# Patient Record
Sex: Male | Born: 1995 | Race: Black or African American | Hispanic: No | Marital: Single | Smoking: Heavy tobacco smoker
Health system: Southern US, Community
[De-identification: ages and names within clinical notes are randomized; demographics above are authoritative.]

---

## 2014-10-27 ENCOUNTER — Emergency Department
Admission: EM | Admit: 2014-10-27 | Discharge: 2014-10-27 | Discharge status: Against Medical Advice | Payer: 59 | Attending: Emergency Medicine | Admitting: Emergency Medicine

## 2014-10-27 DIAGNOSIS — M79673 Pain in unspecified foot: Secondary | ICD-10-CM | POA: Insufficient documentation

## 2014-10-27 DIAGNOSIS — Z720 Tobacco use: Secondary | ICD-10-CM | POA: Insufficient documentation

## 2014-10-27 NOTE — ED Notes (Signed)
Due to pt being registered pt could not be dismissed out of the computer. Pt was not triaged and brought straight back to the room.  Pt left with a few min's of getting into the room and this writer was told by registration that the pt had left. Will have to d/c pt to get him off the board, but he should be a dismiss.

## 2014-10-27 NOTE — ED Notes (Signed)
Registration out to the nurses station stating that the pt had to leave and that he would be back in an hr. Pt was informed by registration that he needed to stay and that he would be charged for another ER visit. Pt stated understanding to registration and left. Pt was not seen or assessed by this Clinical research associate.

## 2014-10-31 ENCOUNTER — Encounter: Payer: Self-pay | Admitting: Emergency Medicine

## 2014-10-31 ENCOUNTER — Emergency Department
Admission: EM | Admit: 2014-10-31 | Discharge: 2014-10-31 | Disposition: A | Discharge status: Home / Self Care | Payer: 59 | Attending: Emergency Medicine | Admitting: Emergency Medicine

## 2014-10-31 ENCOUNTER — Emergency Department: Payer: 59

## 2014-10-31 DIAGNOSIS — Z720 Tobacco use: Secondary | ICD-10-CM | POA: Insufficient documentation

## 2014-10-31 DIAGNOSIS — R110 Nausea: Secondary | ICD-10-CM | POA: Diagnosis not present

## 2014-10-31 DIAGNOSIS — R079 Chest pain, unspecified: Secondary | ICD-10-CM | POA: Insufficient documentation

## 2014-10-31 LAB — CBC
HCT: 44.7 % (ref 40.0–52.0)
Hemoglobin: 14.8 g/dL (ref 13.0–18.0)
MCH: 29 pg (ref 26.0–34.0)
MCHC: 33.1 g/dL (ref 32.0–36.0)
MCV: 87.8 fL (ref 80.0–100.0)
PLATELETS: 296 10*3/uL (ref 150–440)
RBC: 5.09 MIL/uL (ref 4.40–5.90)
RDW: 13.6 % (ref 11.5–14.5)
WBC: 4.7 10*3/uL (ref 3.8–10.6)

## 2014-10-31 LAB — COMPREHENSIVE METABOLIC PANEL
ALBUMIN: 4.6 g/dL (ref 3.5–5.0)
ALK PHOS: 78 U/L (ref 38–126)
ALT: 14 U/L — ABNORMAL LOW (ref 17–63)
AST: 18 U/L (ref 15–41)
Anion gap: 6 (ref 5–15)
BILIRUBIN TOTAL: 0.3 mg/dL (ref 0.3–1.2)
BUN: 11 mg/dL (ref 6–20)
CALCIUM: 9.4 mg/dL (ref 8.9–10.3)
CO2: 29 mmol/L (ref 22–32)
CREATININE: 0.8 mg/dL (ref 0.61–1.24)
Chloride: 107 mmol/L (ref 101–111)
GFR calc Af Amer: >60 mL/min (ref >60)
GLUCOSE: 91 mg/dL (ref 65–99)
Potassium: 4 mmol/L (ref 3.5–5.1)
Sodium: 142 mmol/L (ref 135–145)
TOTAL PROTEIN: 7.2 g/dL (ref 6.5–8.1)

## 2014-10-31 LAB — TROPONIN I

## 2014-10-31 MED ORDER — GI COCKTAIL ~~LOC~~: 30.0000 mL | Freq: Once | ORAL | Status: DC

## 2014-10-31 NOTE — ED Notes (Signed)
Nurse to room to administer medication. Pt gown on bed. Pt not in room. Pt appears to have left.

## 2014-10-31 NOTE — ED Notes (Signed)
Pt left without undergoing the nursing  discharge process receiving discharge paperwork. Pt contacted by this nurse and informed him that his discharge instructions will be available for him to pick up at the discharge desk.

## 2014-10-31 NOTE — Discharge Instructions (Signed)
Chest Pain (Nonspecific) It is often hard to give a diagnosis for the cause of chest pain. There is always a chance that your pain could be related to something serious, such as a heart attack or a blood clot in the lungs. You need to follow up with your doctor. HOME CARE  If antibiotic medicine was given, take it as directed by your doctor. Finish the medicine even if you start to feel better.  For the next few days, avoid activities that bring on chest pain. Continue physical activities as told by your doctor.  Do not use any tobacco products. This includes cigarettes, chewing tobacco, and e-cigarettes.  Avoid drinking alcohol.  Only take medicine as told by your doctor.  Follow your doctor's suggestions for more testing if your chest pain does not go away.  Keep all doctor visits you made. GET HELP IF:  Your chest pain does not go away, even after treatment.  You have a rash with blisters on your chest.  You have a fever. GET HELP RIGHT AWAY IF:   You have more pain or pain that spreads to your arm, neck, jaw, back, or belly (abdomen).  You have shortness of breath.  You cough more than usual or cough up blood.  You have very bad back or belly pain.  You feel sick to your stomach (nauseous) or throw up (vomit).  You have very bad weakness.  You pass out (faint).  You have chills. This is an emergency. Do not wait to see if the problems will go away. Call your local emergency services (911 in [place] Do not drive yourself to the hospital. MAKE SURE YOU:   Understand these instructions.  Will watch your condition.  Will get help right away if you are not doing well or get worse. Document Released: 07/12/2007 Document Revised: 01/28/2013 Document Reviewed: 07/12/2007 [facility] Patient Information 2015 [place], [place]. This information is not intended to replace advice given to you by your health care provider. Make sure you discuss any questions you have with your  health care provider.   Please drink alcohol in moderation. Eat small meals throughout the day rather than large meals in one sitting.  Please return to the emergency department if he develops chest pain, fainting, palpitations, shortness of breath, or any other symptoms concerning to you.

## 2014-10-31 NOTE — ED Notes (Signed)
Pt arrived to the ED for complaints of chest pain x2 hr that woke him up from her sleep. Pt reports that he does not have any kind of cardiac history. Pt is AOx4 in no apparent distress.

## 2014-10-31 NOTE — ED Provider Notes (Signed)
[facility] Emergency Department [place] Note  ____________________________________________  Time seen: Approximately 8:28 AM  I have reviewed the triage vital signs and the nursing notes.   HISTORY  Chief Complaint Chest Pain    HPI Jonathan Hubbard is a 19 y.o. male , smoker, presenting with chest pain. Patient reports that he awoke 30 minutes prior to arrival with a midline substernal "ache" in his chest that was nonradiating. No associated diaphoresis, vomiting, palpitations, lightheadedness or syncope. He does report some mild nausea but that has resolved. He reports that last night he ate pizza and drank multiple alcoholic beverages. At this time, the patient continues to have a dull ache but symptoms are improved.    History reviewed. No pertinent past medical history. Denies cardiac history.   There are no active problems to display for this patient.  none  History reviewed. No pertinent past surgical history.  No current outpatient prescriptions on file.  Allergies Review of patient's allergies indicates no known allergies.  History reviewed. No pertinent family history.  Denies history of sudden cardiac death or early, unexplained deaths in his family.  Social History Social History  Substance Use Topics  . Smoking status: Heavy Tobacco Smoker  . Smokeless tobacco: None  . Alcohol Use: Yes   denies cocaine; occasional marijuana user  Review of Systems Constitutional: No fever/chills Eyes: No visual changes. ENT: No sore throat. Cardiovascular: Denies chest pain, palpitations. Respiratory: Denies shortness of breath.  No cough. Gastrointestinal: No abdominal pain.  No nausea, no vomiting.  No diarrhea.  No constipation. Genitourinary: Negative for dysuria. Musculoskeletal: Negative for back pain. Skin: Negative for rash. Neurological: Negative for headaches, focal weakness or numbness.  10-point ROS otherwise  negative.  ____________________________________________   PHYSICAL EXAM:  VITAL SIGNS: ED Triage Vitals  Enc Vitals Group     BP 10/31/14 0616 118/69 mmHg     Pulse Rate 10/31/14 0616 68     Resp 10/31/14 0616 18     Temp 10/31/14 0616 98 F (36.7 C)     Temp Source 10/31/14 0616 Oral     SpO2 10/31/14 0616 99 %     Weight 10/31/14 0616 145 lb (65.772 kg)     Height 10/31/14 0616  (1.753 m)     Head Cir --      Peak Flow --      Pain Score 10/31/14 0617 8     Pain Loc --      Pain Edu? --      Excl. in GC? --     Constitutional: Alert and oriented. Well appearing and in no acute distress. Answer question appropriately. Eyes: Conjunctivae are normal.  EOMI. Head: Atraumatic. Nose: No congestion/rhinnorhea. Mouth/Throat: Mucous membranes are moist.  Neck: No stridor.  Supple.  No JVD Cardiovascular: Normal rate, regular rhythm. No murmurs, rubs or gallops.  Respiratory: Normal respiratory effort.  No retractions. Lungs CTAB.  No wheezes, rales or ronchi. Gastrointestinal: Soft and nontender. No distention. No peritoneal signs. Musculoskeletal: No LE edema. No palpable cords or Homans sign. Neurologic:  Normal speech and language. No gross focal neurologic deficits are appreciated.  Skin:  Skin is warm, dry and intact. No rash noted. Psychiatric: Mood and affect are normal. Speech and behavior are normal.  Normal judgement.  ____________________________________________   LABS (all labs ordered are listed, but only abnormal results are displayed)  Labs Reviewed  COMPREHENSIVE METABOLIC PANEL - Abnormal; Notable for the following:    ALT 14 (*)  All other components within normal limits  CBC  TROPONIN I   ____________________________________________  EKG  ED ECG REPORT I, Rockne Menghini, the attending physician, personally viewed and interpreted this ECG.   Date: 10/31/2014  EKG Time: 6:15  Rate: 61  Rhythm: normal sinus rhythm  Axis: normal   Intervals:none  ST&T Change: Nonspecific T-wave inversion in V1. 1 mm ST elevation in V2 and V3. The findings on this EKG are most consistent with early re-polarization rather than ischemic changes.  There are no previous EKGs on file for comparison.  ____________________________________________  RADIOLOGY  Dg Chest 2 View  10/31/2014   CLINICAL DATA:  Chest pain this morning.  Nonsmoker.  EXAM: CHEST  2 VIEW  COMPARISON:  None.  FINDINGS: Hyperinflation. The heart size and mediastinal contours are within normal limits. Both lungs are clear. The visualized skeletal structures are unremarkable.  IMPRESSION: No active cardiopulmonary disease.   Electronically Signed   By: Burman Nieves M.D.   On: 10/31/2014 06:42    ____________________________________________   PROCEDURES  Procedure(s) performed: None  Critical Care performed: No ____________________________________________   INITIAL IMPRESSION / ASSESSMENT AND PLAN / ED COURSE  Pertinent labs & imaging results that were available during my care of the patient were reviewed by me and considered in my medical decision making (see chart for details).  19 y.o. male without significant cardiac risk factors and his personal or family history presenting with chest pain. The patient's EKG does not show any ischemic changes, his labs are reassuring and his chest x-ray does not show any acute cardiopulmonary disease. It is possible that he has reflux after an evening of pizza and drinking. He does not have an arrhythmia and acute MI is unlikely especially because he denies cocaine. Dissection is also unlikely, patient has no clinical symptoms that would be consistent with PE. There are no symptoms for an infectious process causing this pain. At this time, we will plan to discharge the patient with close PMD follow-up. He understands return precautions and red flag warnings for which to come  back.  ____________________________________________  FINAL CLINICAL IMPRESSION(S) / ED DIAGNOSES  Final diagnoses:  Chest pain, unspecified chest pain type  Nausea without vomiting      NEW MEDICATIONS STARTED DURING THIS VISIT:  New Prescriptions   No medications on file     Rockne Menghini, MD 10/31/14 [phone]

## 2017-04-22 IMAGING — CR DG CHEST 2V
1 series · 2 of 2 positions shown · non-contrast
Comparison: None.

CLINICAL DATA: Chest pain this morning.  Nonsmoker.

EXAM:
CHEST  2 VIEW

[Series 1: dg chest 2 view · 0.14mm/px · 2 of 2 slices shown]
[im 1/2]
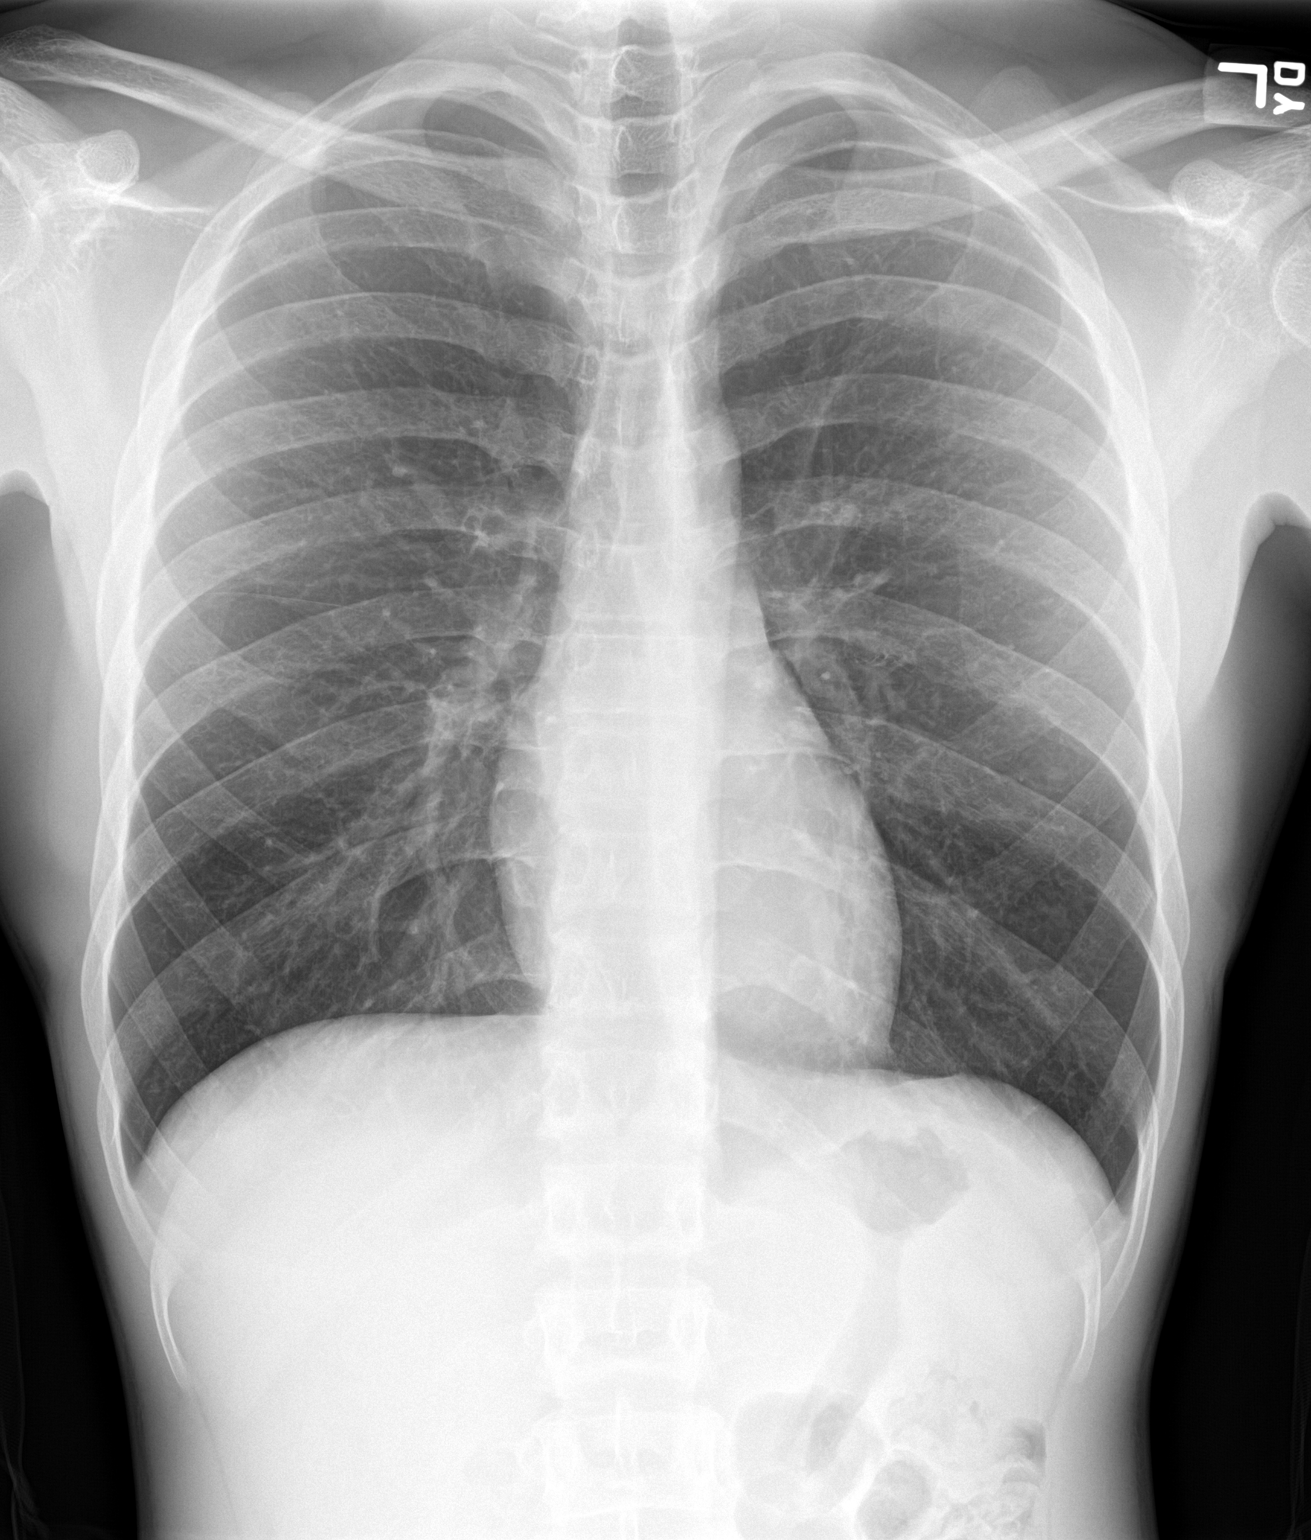
[im 2/2]
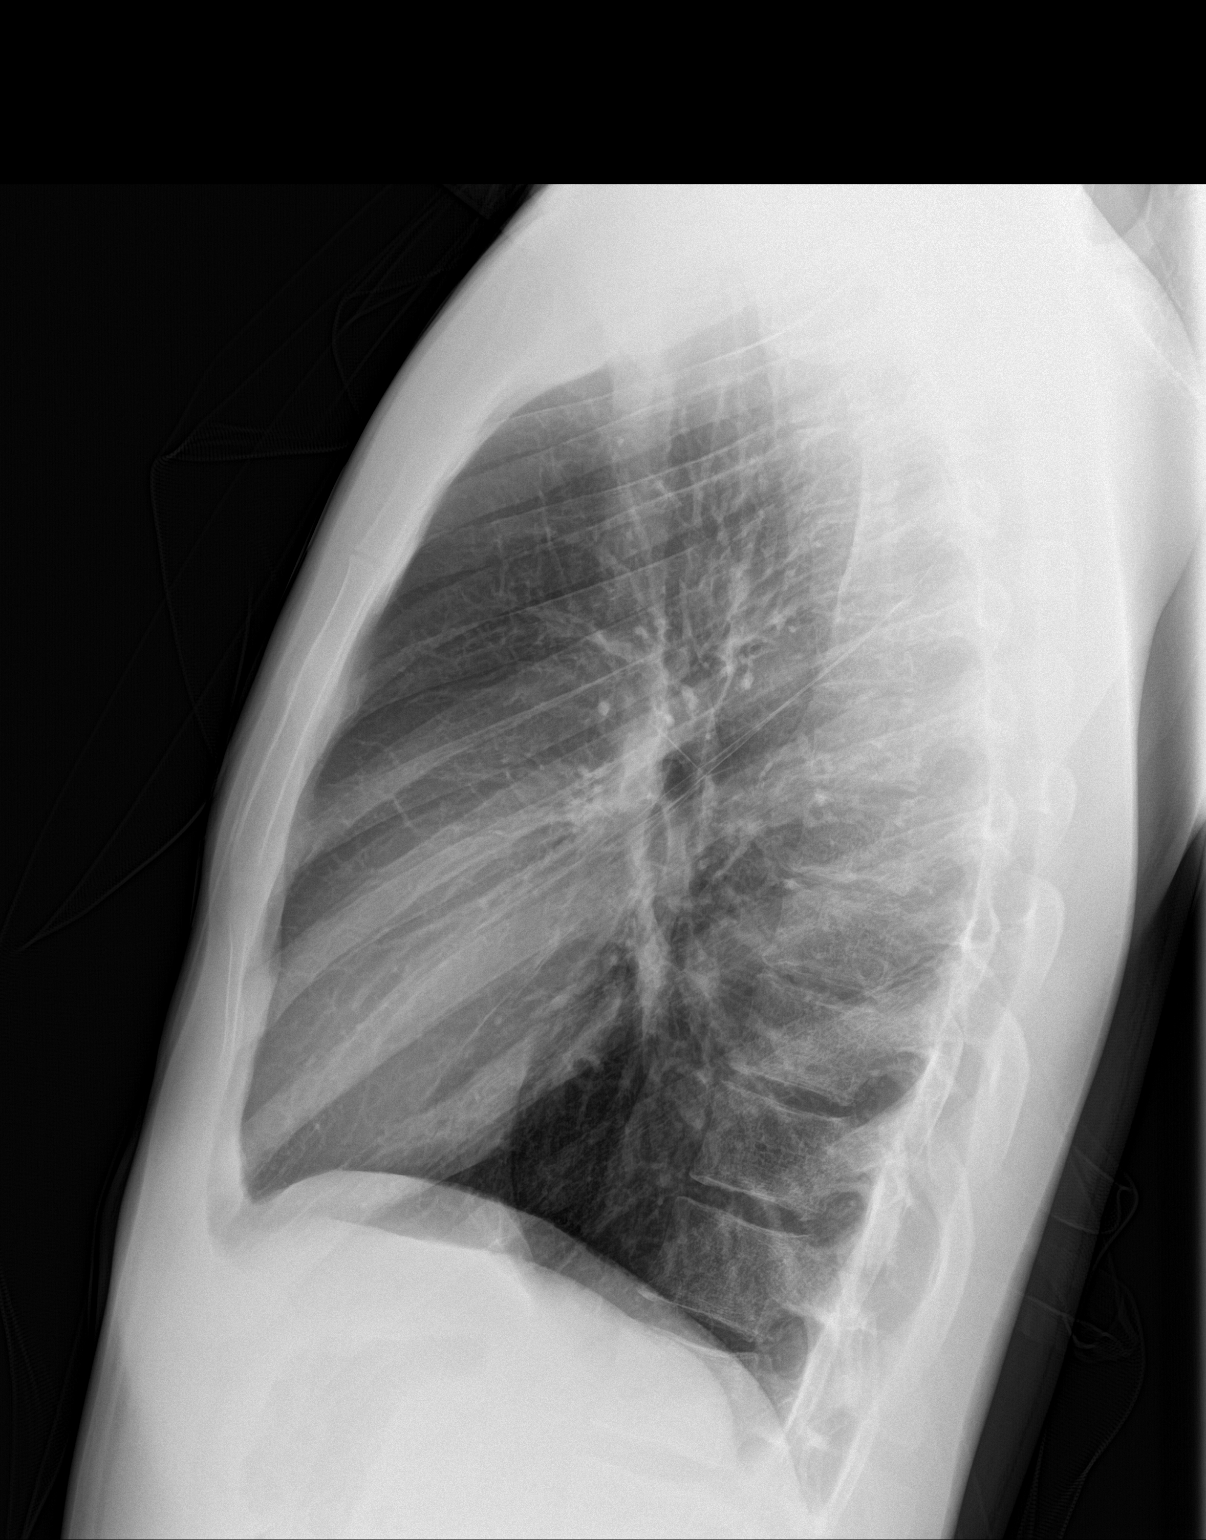

[2 of 2 positions shown; findings below may reference images not displayed]

FINDINGS: Hyperinflation. The heart size and mediastinal contours are within
normal limits. Both lungs are clear. The visualized skeletal
structures are unremarkable.
IMPRESSION: No active cardiopulmonary disease.
